# Patient Record
Sex: Male | Born: 1985 | Race: Black or African American | Hispanic: No | Marital: Married | Smoking: Never smoker
Health system: Southern US, Community
[De-identification: ages and names within clinical notes are randomized; demographics above are authoritative.]

---

## 2013-05-21 ENCOUNTER — Emergency Department (HOSPITAL_COMMUNITY)
Admission: EM | Admit: 2013-05-21 | Discharge: 2013-05-22 | Disposition: A | Discharge status: Home / Self Care | Payer: Worker's Compensation | Attending: Emergency Medicine | Admitting: Emergency Medicine

## 2013-05-21 ENCOUNTER — Encounter (HOSPITAL_COMMUNITY): Payer: Self-pay | Admitting: Emergency Medicine

## 2013-05-21 DIAGNOSIS — M2550 Pain in unspecified joint: Secondary | ICD-10-CM | POA: Diagnosis not present

## 2013-05-21 DIAGNOSIS — R220 Localized swelling, mass and lump, head: Secondary | ICD-10-CM | POA: Insufficient documentation

## 2013-05-21 DIAGNOSIS — L299 Pruritus, unspecified: Secondary | ICD-10-CM | POA: Insufficient documentation

## 2013-05-21 DIAGNOSIS — R6883 Chills (without fever): Secondary | ICD-10-CM | POA: Insufficient documentation

## 2013-05-21 DIAGNOSIS — L02419 Cutaneous abscess of limb, unspecified: Secondary | ICD-10-CM | POA: Insufficient documentation

## 2013-05-21 DIAGNOSIS — L509 Urticaria, unspecified: Secondary | ICD-10-CM | POA: Insufficient documentation

## 2013-05-21 DIAGNOSIS — R221 Localized swelling, mass and lump, neck: Secondary | ICD-10-CM | POA: Diagnosis not present

## 2013-05-21 DIAGNOSIS — L03115 Cellulitis of right lower limb: Secondary | ICD-10-CM

## 2013-05-21 DIAGNOSIS — L03119 Cellulitis of unspecified part of limb: Secondary | ICD-10-CM | POA: Insufficient documentation

## 2013-05-21 LAB — CBC WITH DIFFERENTIAL/PLATELET
Basophils Absolute: 0 K/uL (ref 0.0–0.1)
Basophils Relative: 0 % (ref 0–1)
Eosinophils Absolute: 0.1 10*3/uL (ref 0.0–0.7)
Eosinophils Relative: 2 % (ref 0–5)
HCT: 45.1 % (ref 39.0–52.0)
Hemoglobin: 15.7 g/dL (ref 13.0–17.0)
Lymphocytes Relative: 19 % (ref 12–46)
Lymphs Abs: 1.5 10*3/uL (ref 0.7–4.0)
MCH: 28.4 pg (ref 26.0–34.0)
MCHC: 34.8 g/dL (ref 30.0–36.0)
MCV: 81.7 fL (ref 78.0–100.0)
Monocytes Absolute: 0.7 10*3/uL (ref 0.1–1.0)
Monocytes Relative: 9 % (ref 3–12)
Neutro Abs: 5.8 K/uL (ref 1.7–7.7)
Neutrophils Relative %: 71 % (ref 43–77)
Platelets: 411 10*3/uL — ABNORMAL HIGH (ref 150–400)
RBC: 5.52 MIL/uL (ref 4.22–5.81)
RDW: 14 % (ref 11.5–15.5)
WBC: 8.1 K/uL (ref 4.0–10.5)

## 2013-05-21 LAB — COMPREHENSIVE METABOLIC PANEL
BUN: 21 mg/dL (ref 6–23)
CO2: 28 mEq/L (ref 19–32)
Calcium: 9.5 mg/dL (ref 8.4–10.5)
Creatinine, Ser: 1.68 mg/dL — ABNORMAL HIGH (ref 0.50–1.35)
GFR calc Af Amer: 64 mL/min — ABNORMAL LOW (ref >90)
GFR calc non Af Amer: 56 mL/min — ABNORMAL LOW (ref >90)
Glucose, Bld: 93 mg/dL (ref 70–99)
Sodium: 134 mEq/L — ABNORMAL LOW (ref 135–145)
Total Protein: 8 g/dL (ref 6.0–8.3)

## 2013-05-21 LAB — COMPREHENSIVE METABOLIC PANEL WITH GFR
ALT: 67 U/L — ABNORMAL HIGH (ref 0–53)
AST: 51 U/L — ABNORMAL HIGH (ref 0–37)
Albumin: 4.1 g/dL (ref 3.5–5.2)
Alkaline Phosphatase: 89 U/L (ref 39–117)
Chloride: 97 meq/L (ref 96–112)
Potassium: 4.1 meq/L (ref 3.5–5.1)
Total Bilirubin: 0.4 mg/dL (ref 0.3–1.2)

## 2013-05-21 MED ORDER — LORATADINE 10 MG PO TABS: 10.0000 mg | ORAL_TABLET | Freq: Every day | ORAL | Status: AC

## 2013-05-21 MED ORDER — CLINDAMYCIN HCL 300 MG PO CAPS
300.0000 mg | ORAL_CAPSULE | Freq: Once | ORAL | Status: AC
  Administered 2013-05-22: 300 mg via ORAL
  Filled 2013-05-21: qty 1

## 2013-05-21 MED ORDER — CLINDAMYCIN HCL 150 MG PO CAPS: 300.0000 mg | ORAL_CAPSULE | Freq: Three times a day (TID) | ORAL | Status: DC

## 2013-05-21 NOTE — Discharge Instructions (Signed)
Cellulitis Cellulitis is an infection of the skin and the tissue beneath it. The infected area is usually red and tender. Cellulitis occurs most often in the arms and lower legs.  CAUSES  Cellulitis is caused by bacteria that enter the skin through cracks or cuts in the skin. The most common types of bacteria that cause cellulitis are Staphylococcus and Streptococcus. SYMPTOMS   Redness and warmth.  Swelling.  Tenderness or pain.  Fever. DIAGNOSIS  Your caregiver can usually determine what is wrong based on a physical exam. Blood tests may also be done. TREATMENT  Treatment usually involves taking an antibiotic medicine. HOME CARE INSTRUCTIONS   Take your antibiotics as directed. Finish them even if you start to feel better.  Keep the infected arm or leg elevated to reduce swelling.  Apply a warm cloth to the affected area up to 4 times per day to relieve pain.  Only take over-the-counter or prescription medicines for pain, discomfort, or fever as directed by your caregiver.  Keep all follow-up appointments as directed by your caregiver. SEEK MEDICAL CARE IF:   You notice red streaks coming from the infected area.  Your red area gets larger or turns dark in color.  Your bone or joint underneath the infected area becomes painful after the skin has healed.  Your infection returns in the same area or another area.  You notice a swollen bump in the infected area.  You develop new symptoms. SEEK IMMEDIATE MEDICAL CARE IF:   You have a fever.  You feel very sleepy.  You develop vomiting or diarrhea.  You have a general ill feeling (malaise) with muscle aches and pains. MAKE SURE YOU:   Understand these instructions.  Will watch your condition.  Will get help right away if you are not doing well or get worse. Document Released: 09/04/2005 Document Revised: 05/26/2012 Document Reviewed: 02/10/2012 [employer] Patient Information 2014 [employer].  

## 2013-05-21 NOTE — ED Provider Notes (Signed)
History     CSN: [id number]  Arrival date & time 05/21/13  2147   First MD Initiated Contact with Patient 05/21/13 2321      Chief Complaint  Patient presents with  . Leg Swelling    (Consider location/radiation/quality/duration/timing/severity/associated sxs/prior treatment) HPI Comments: Pt walks a lot at his new job of 6 months.  He may have developed a blister, he pulled a "scab" off the top of his great toe on right foot this AM.  By tonight, right lower leg has developed redness, some swelling, not up to his knee.  Some chills, no overt fevers.  He denies h/o DM, HIV.  Also his chronic urticaria, he gets whelps on his arms when he scratches which makes itching worse.  No sig SOB, will get hives and his face and eyes will get puffy at times.  No wheezing, stridor, no throat swelling.  He attributes to timing that he got his new job.  Has not taken anything specific for it.     Patient is a 27 y.o. male presenting with urticaria and leg pain. The history is provided by the patient and the spouse.  Urticaria This is a chronic problem. Episode onset: 5-6 months ago when he began his new job. The problem occurs every several days. The problem has not changed since onset.Pertinent negatives include no shortness of breath. Exacerbated by: possibly being at work. Nothing relieves the symptoms. He has tried nothing for the symptoms.  Leg Pain Location:  Foot and leg Time since incident:  1 day Leg location:  R lower leg Foot location:  Dorsum of R foot Pain details:    Quality:  Burning, throbbing and aching   Radiates to:  Does not radiate   Severity:  Moderate   Onset quality:  Gradual   Duration:  1 day   Timing:  Constant   Progression:  Worsening Chronicity:  New Relieved by:  NSAIDs Worsened by:  Nothing tried Associated symptoms: swelling   Associated symptoms: no muscle weakness and no tingling   Associated symptoms comment:  Chills   History reviewed. No pertinent past  medical history.  History reviewed. No pertinent past surgical history.  History reviewed. No pertinent family history.  History  Substance Use Topics  . Smoking status: Never Smoker   . Smokeless tobacco: Not on file  . Alcohol Use: Yes      Review of Systems  Constitutional: Positive for chills.  HENT: Positive for facial swelling. Negative for sore throat and trouble swallowing.   Eyes: Positive for itching. Negative for redness.  Respiratory: Negative for shortness of breath.   Gastrointestinal: Negative for nausea, vomiting and diarrhea.  Musculoskeletal: Positive for arthralgias.  Skin: Positive for color change and wound.  All other systems reviewed and are negative.    Allergies  Shrimp  Home Medications   Current Outpatient Rx  Name  Route  Sig  Dispense  Refill  . Ibuprofen (IBU PO)   Oral   Take 2 tablets by mouth once.         . clindamycin (CLEOCIN) 150 MG capsule   Oral   Take 2 capsules (300 mg total) by mouth 3 (three) times daily.   30 capsule   0   . loratadine (CLARITIN) 10 MG tablet   Oral   Take 1 tablet (10 mg total) by mouth daily.   30 tablet   0     BP 150/57  Pulse 90  Temp(Src) 99.5 F (37.5  C) (Oral)  Resp 16  SpO2 98%  Physical Exam  Nursing note and vitals reviewed. Constitutional: He appears well-developed and well-nourished. No distress.  HENT:  Head: Normocephalic and atraumatic.  Eyes: Conjunctivae are normal. Pupils are equal, round, and reactive to light. Right eye exhibits no discharge. Left eye exhibits no discharge. No scleral icterus.  Cardiovascular: Normal rate, regular rhythm and intact distal pulses.   No murmur heard. Pulmonary/Chest: Effort normal. No respiratory distress. He has no wheezes.  Musculoskeletal: He exhibits edema and tenderness.       Right lower leg: He exhibits tenderness, swelling and edema.       Legs: Neurological: He is alert.  Skin: Skin is warm and dry. No rash noted. He is  not diaphoretic.    ED Course  Procedures (including critical care time)  Labs Reviewed  CBC WITH DIFFERENTIAL - Abnormal; Notable for the following:    Platelets 411 (*)    All other components within normal limits  COMPREHENSIVE METABOLIC PANEL - Abnormal; Notable for the following:    Sodium 134 (*)    Creatinine, Ser 1.68 (*)    AST 51 (*)    ALT 67 (*)    GFR calc non Af Amer 56 (*)    GFR calc Af Amer 64 (*)    All other components within normal limits   No results found.   1. Cellulitis of right lower leg     ra sat is 98% and I interpret to be normal  MDM  Pt with cellulitis of right foot and lower leg.  Pt is healthy, not septic appearing.  Ibuprofen, po abx recommended.  Also will give claritin for chronic urticaria, possibly related to allergen at work or due to environment.  No signs of anaphylaxis.             Gavin Pound. Oletta Lamas, MD 05/22/13 1610

## 2013-05-21 NOTE — ED Notes (Signed)
PT. REPORTS PROGRESSING RIGHT LOWER LEG  AND RIGHT FOOT PAIN / SWELLING ONSET LAST NIGHT WITH LOW GRADE FEVER / CHILLS.

## 2013-05-22 MED ORDER — CLINDAMYCIN HCL 300 MG PO CAPS: 300.0000 mg | ORAL_CAPSULE | Freq: Three times a day (TID) | ORAL | Status: AC

## 2013-10-09 ENCOUNTER — Emergency Department (HOSPITAL_COMMUNITY)
Admission: EM | Admit: 2013-10-09 | Discharge: 2013-10-09 | Disposition: A | Discharge status: Home / Self Care | Payer: BC Managed Care – PPO | Attending: Emergency Medicine | Admitting: Emergency Medicine

## 2013-10-09 ENCOUNTER — Emergency Department (HOSPITAL_COMMUNITY): Payer: BC Managed Care – PPO

## 2013-10-09 ENCOUNTER — Encounter (HOSPITAL_COMMUNITY): Payer: Self-pay | Admitting: Emergency Medicine

## 2013-10-09 DIAGNOSIS — R7402 Elevation of levels of lactic acid dehydrogenase (LDH): Secondary | ICD-10-CM | POA: Insufficient documentation

## 2013-10-09 DIAGNOSIS — S21209A Unspecified open wound of unspecified back wall of thorax without penetration into thoracic cavity, initial encounter: Secondary | ICD-10-CM | POA: Insufficient documentation

## 2013-10-09 DIAGNOSIS — R7401 Elevation of levels of liver transaminase levels: Secondary | ICD-10-CM | POA: Insufficient documentation

## 2013-10-09 DIAGNOSIS — S01501A Unspecified open wound of lip, initial encounter: Secondary | ICD-10-CM | POA: Insufficient documentation

## 2013-10-09 DIAGNOSIS — R7989 Other specified abnormal findings of blood chemistry: Secondary | ICD-10-CM

## 2013-10-09 DIAGNOSIS — Z23 Encounter for immunization: Secondary | ICD-10-CM | POA: Insufficient documentation

## 2013-10-09 DIAGNOSIS — S0083XA Contusion of other part of head, initial encounter: Secondary | ICD-10-CM

## 2013-10-09 DIAGNOSIS — S01511A Laceration without foreign body of lip, initial encounter: Secondary | ICD-10-CM

## 2013-10-09 DIAGNOSIS — S0003XA Contusion of scalp, initial encounter: Secondary | ICD-10-CM | POA: Insufficient documentation

## 2013-10-09 DIAGNOSIS — S21211A Laceration without foreign body of right back wall of thorax without penetration into thoracic cavity, initial encounter: Secondary | ICD-10-CM

## 2013-10-09 LAB — COMPREHENSIVE METABOLIC PANEL
ALT: 43 U/L (ref 0–53)
Alkaline Phosphatase: 96 U/L (ref 39–117)
BUN: 18 mg/dL (ref 6–23)
Calcium: 9.5 mg/dL (ref 8.4–10.5)
Chloride: 97 mEq/L (ref 96–112)
Creatinine, Ser: 1.42 mg/dL — ABNORMAL HIGH (ref 0.50–1.35)
GFR calc Af Amer: 78 mL/min — ABNORMAL LOW (ref >90)
Glucose, Bld: 131 mg/dL — ABNORMAL HIGH (ref 70–99)
Total Protein: 8.4 g/dL — ABNORMAL HIGH (ref 6.0–8.3)

## 2013-10-09 LAB — TYPE AND SCREEN
ABO/RH(D): B POS
Antibody Screen: NEGATIVE
Unit division: 0
Unit division: 0
Unit division: 0

## 2013-10-09 LAB — CBC
HCT: 45.2 % (ref 39.0–52.0)
Hemoglobin: 15.1 g/dL (ref 13.0–17.0)
MCHC: 33.4 g/dL (ref 30.0–36.0)
Platelets: ADEQUATE 10*3/uL (ref 150–400)
RBC: 5.21 MIL/uL (ref 4.22–5.81)
RDW: 13.3 % (ref 11.5–15.5)
WBC: 6.5 10*3/uL (ref 4.0–10.5)

## 2013-10-09 LAB — ABO/RH: ABO/RH(D): B POS

## 2013-10-09 LAB — POCT I-STAT, CHEM 8
BUN: 19 mg/dL (ref 6–23)
Chloride: 102 mEq/L (ref 96–112)
Potassium: 3.5 mEq/L (ref 3.5–5.1)
Sodium: 139 mEq/L (ref 135–145)
TCO2: 20 mmol/L (ref 0–100)

## 2013-10-09 LAB — PROTIME-INR
INR: 1.09 (ref 0.00–1.49)
Prothrombin Time: 13.9 seconds (ref 11.6–15.2)

## 2013-10-09 LAB — CG4 I-STAT (LACTIC ACID): Lactic Acid, Venous: 8.02 mmol/L — ABNORMAL HIGH (ref 0.5–2.2)

## 2013-10-09 MED ORDER — FENTANYL CITRATE 0.05 MG/ML IJ SOLN
50.0000 ug | INTRAMUSCULAR | Status: DC | PRN
  Administered 2013-10-09: 50 ug via INTRAVENOUS

## 2013-10-09 MED ORDER — TETANUS-DIPHTH-ACELL PERTUSSIS 5-2.5-18.5 LF-MCG/0.5 IM SUSP
0.5000 mL | Freq: Once | INTRAMUSCULAR | Status: AC
  Administered 2013-10-09: 0.5 mL via INTRAMUSCULAR

## 2013-10-09 MED ORDER — OXYCODONE-ACETAMINOPHEN 5-325 MG PO TABS: 1.0000 | ORAL_TABLET | ORAL | Status: AC | PRN

## 2013-10-09 MED ORDER — TETANUS-DIPHTH-ACELL PERTUSSIS 5-2.5-18.5 LF-MCG/0.5 IM SUSP
INTRAMUSCULAR | Status: AC
  Filled 2013-10-09: qty 0.5

## 2013-10-09 MED ORDER — MORPHINE SULFATE 4 MG/ML IJ SOLN
4.0000 mg | Freq: Once | INTRAMUSCULAR | Status: AC
  Administered 2013-10-09: 4 mg via INTRAVENOUS
  Filled 2013-10-09: qty 1

## 2013-10-09 MED ORDER — SODIUM CHLORIDE 0.9 % IV BOLUS (SEPSIS)
1000.0000 mL | Freq: Once | INTRAVENOUS | Status: AC
  Administered 2013-10-09: 1000 mL via INTRAVENOUS

## 2013-10-09 MED ORDER — AMOXICILLIN 500 MG PO CAPS
1000.0000 mg | ORAL_CAPSULE | Freq: Once | ORAL | Status: AC
  Administered 2013-10-09: 1000 mg via ORAL
  Filled 2013-10-09: qty 2

## 2013-10-09 MED ORDER — IOHEXOL 300 MG/ML  SOLN
100.0000 mL | Freq: Once | INTRAMUSCULAR | Status: AC | PRN
  Administered 2013-10-09: 100 mL via INTRAVENOUS

## 2013-10-09 MED ORDER — OXYCODONE-ACETAMINOPHEN 5-325 MG PO TABS
1.0000 | ORAL_TABLET | Freq: Once | ORAL | Status: AC
  Administered 2013-10-09: 1 via ORAL
  Filled 2013-10-09: qty 1

## 2013-10-09 MED ORDER — ONDANSETRON HCL 4 MG/2ML IJ SOLN
4.0000 mg | Freq: Once | INTRAMUSCULAR | Status: AC
  Administered 2013-10-09: 4 mg via INTRAVENOUS
  Filled 2013-10-09: qty 2

## 2013-10-09 MED ORDER — AMOXICILLIN 500 MG PO TABS: 1000.0000 mg | ORAL_TABLET | Freq: Two times a day (BID) | ORAL | Status: AC

## 2013-10-09 MED ORDER — SODIUM CHLORIDE 0.9 % IV BOLUS (SEPSIS)
100.0000 mL | Freq: Once | INTRAVENOUS | Status: AC
  Administered 2013-10-09: 1000 mL via INTRAVENOUS

## 2013-10-09 MED ORDER — FENTANYL CITRATE 0.05 MG/ML IJ SOLN
INTRAMUSCULAR | Status: AC
Start: 2013-10-09 — End: 2013-10-09
  Administered 2013-10-09: 50 ug via INTRAVENOUS
  Filled 2013-10-09: qty 2

## 2013-10-09 NOTE — ED Notes (Signed)
CT finished

## 2013-10-09 NOTE — ED Notes (Signed)
back from CT, up to w/c to go to b/r. Mentions some nausea.

## 2013-10-09 NOTE — ED Notes (Signed)
Lab at BS 
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
## 2013-10-09 NOTE — ED Notes (Signed)
Pt attempting to use urinal, unable, will try again, family at [facility] x2, no changes, VSS, pt to CT via stretcher.

## 2013-10-09 NOTE — ED Notes (Signed)
Into CT1, VSS, no changes.

## 2013-10-09 NOTE — ED Notes (Signed)
Dr. Teodoro Fleeting in to speak with and update family.

## 2013-10-09 NOTE — ED Notes (Signed)
Pt and family given PO juice, tolerating.
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
## 2013-10-09 NOTE — ED Notes (Signed)
Scan beginning, no changes, tolerating CT.
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
## 2013-10-09 NOTE — ED Notes (Addendum)
Xray finished at [facility]. Clamer, alert, NAD, calm, interactive, skin W&D, resps e/u, speaking in clear complete sentences. Wife in w/r.
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
## 2013-10-09 NOTE — ED Notes (Signed)
To CT, pt speaking with [employer].

## 2013-10-09 NOTE — ED Notes (Signed)
NRB applied, alert, anxious, hyperventilating, c-collar removed by Dr. Lindie Spruce.

## 2013-10-09 NOTE — ED Notes (Signed)
oneg emergency blood arrived to room
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
## 2013-10-11 LAB — BLOOD PRODUCT ORDER (VERBAL) VERIFICATION

## 2013-10-12 ENCOUNTER — Encounter (HOSPITAL_COMMUNITY): Payer: Self-pay | Admitting: Emergency Medicine

## 2014-01-27 ENCOUNTER — Emergency Department (HOSPITAL_BASED_OUTPATIENT_CLINIC_OR_DEPARTMENT_OTHER): Payer: BC Managed Care – PPO

## 2014-01-27 ENCOUNTER — Encounter (HOSPITAL_BASED_OUTPATIENT_CLINIC_OR_DEPARTMENT_OTHER): Payer: Self-pay | Admitting: Emergency Medicine

## 2014-01-27 ENCOUNTER — Emergency Department (HOSPITAL_BASED_OUTPATIENT_CLINIC_OR_DEPARTMENT_OTHER)
Admission: EM | Admit: 2014-01-27 | Discharge: 2014-01-27 | Disposition: A | Discharge status: Home / Self Care | Payer: BC Managed Care – PPO | Attending: Emergency Medicine | Admitting: Emergency Medicine

## 2014-01-27 DIAGNOSIS — R1031 Right lower quadrant pain: Secondary | ICD-10-CM | POA: Insufficient documentation

## 2014-01-27 DIAGNOSIS — Z79899 Other long term (current) drug therapy: Secondary | ICD-10-CM | POA: Insufficient documentation

## 2014-01-27 DIAGNOSIS — R109 Unspecified abdominal pain: Secondary | ICD-10-CM

## 2014-01-27 DIAGNOSIS — K529 Noninfective gastroenteritis and colitis, unspecified: Secondary | ICD-10-CM

## 2014-01-27 DIAGNOSIS — K5289 Other specified noninfective gastroenteritis and colitis: Secondary | ICD-10-CM | POA: Insufficient documentation

## 2014-01-27 DIAGNOSIS — Z792 Long term (current) use of antibiotics: Secondary | ICD-10-CM | POA: Insufficient documentation

## 2014-01-27 LAB — COMPREHENSIVE METABOLIC PANEL
ALT: 35 U/L (ref 0–53)
AST: 31 U/L (ref 0–37)
Albumin: 4.1 g/dL (ref 3.5–5.2)
Alkaline Phosphatase: 71 U/L (ref 39–117)
BUN: 11 mg/dL (ref 6–23)
CALCIUM: 8.9 mg/dL (ref 8.4–10.5)
CO2: 25 mEq/L (ref 19–32)
Chloride: 98 mEq/L (ref 96–112)
Creatinine, Ser: 1.2 mg/dL (ref 0.50–1.35)
GFR calc non Af Amer: 83 mL/min — ABNORMAL LOW (ref >90)
GLUCOSE: 109 mg/dL — AB (ref 70–99)
Potassium: 3.7 mEq/L (ref 3.7–5.3)
SODIUM: 136 meq/L — AB (ref 137–147)
TOTAL PROTEIN: 7.5 g/dL (ref 6.0–8.3)
Total Bilirubin: 0.8 mg/dL (ref 0.3–1.2)

## 2014-01-27 LAB — CBC WITH DIFFERENTIAL/PLATELET
Basophils Absolute: 0 10*3/uL (ref 0.0–0.1)
Basophils Relative: 0 % (ref 0–1)
EOS ABS: 0 10*3/uL (ref 0.0–0.7)
EOS PCT: 1 % (ref 0–5)
HCT: 45.5 % (ref 39.0–52.0)
Hemoglobin: 15.1 g/dL (ref 13.0–17.0)
LYMPHS ABS: 1 10*3/uL (ref 0.7–4.0)
Lymphocytes Relative: 19 % (ref 12–46)
MCH: 28.4 pg (ref 26.0–34.0)
MCHC: 33.2 g/dL (ref 30.0–36.0)
MCV: 85.5 fL (ref 78.0–100.0)
Monocytes Absolute: 0.6 10*3/uL (ref 0.1–1.0)
Monocytes Relative: 11 % (ref 3–12)
Neutro Abs: 3.6 10*3/uL (ref 1.7–7.7)
Neutrophils Relative %: 69 % (ref 43–77)
PLATELETS: 366 10*3/uL (ref 150–400)
RBC: 5.32 MIL/uL (ref 4.22–5.81)
RDW: 14.1 % (ref 11.5–15.5)
WBC: 5.3 10*3/uL (ref 4.0–10.5)

## 2014-01-27 MED ORDER — IOHEXOL 300 MG/ML  SOLN
50.0000 mL | Freq: Once | INTRAMUSCULAR | Status: AC | PRN
  Administered 2014-01-27: 50 mL via ORAL

## 2014-01-27 MED ORDER — SODIUM CHLORIDE 0.9 % IV SOLN
Freq: Once | INTRAVENOUS | Status: AC
Start: 2014-01-27 — End: 2014-01-27
  Administered 2014-01-27: 1000 mL via INTRAVENOUS

## 2014-01-27 MED ORDER — ONDANSETRON HCL 8 MG PO TABS: 8.0000 mg | ORAL_TABLET | ORAL | Status: AC | PRN

## 2014-01-27 MED ORDER — SODIUM CHLORIDE 0.9 % IV SOLN
Freq: Once | INTRAVENOUS | Status: AC
  Administered 2014-01-27: 1000 mL via INTRAVENOUS

## 2014-01-27 MED ORDER — IOHEXOL 300 MG/ML  SOLN
100.0000 mL | Freq: Once | INTRAMUSCULAR | Status: AC | PRN
  Administered 2014-01-27: 100 mL via INTRAVENOUS

## 2014-01-27 MED ORDER — ONDANSETRON HCL 4 MG/2ML IJ SOLN
4.0000 mg | Freq: Once | INTRAMUSCULAR | Status: AC
  Administered 2014-01-27: 4 mg via INTRAVENOUS
  Filled 2014-01-27: qty 2

## 2014-01-27 MED ORDER — MORPHINE SULFATE 4 MG/ML IJ SOLN
4.0000 mg | Freq: Once | INTRAMUSCULAR | Status: AC
  Administered 2014-01-27: 4 mg via INTRAVENOUS
  Filled 2014-01-27: qty 1

## 2014-01-27 NOTE — Discharge Instructions (Signed)
Zofran as prescribed as needed for nausea.  Return to the emergency department for severe abdominal pain, bloody stool, high fever, or other new or concerning symptoms.   Viral Gastroenteritis Viral gastroenteritis is also known as stomach flu. This condition affects the stomach and intestinal tract. It can cause sudden diarrhea and vomiting. The illness typically lasts 3 to 8 days. Most people develop an immune response that eventually gets rid of the virus. While this natural response develops, the virus can make you quite ill. CAUSES  Many different viruses can cause gastroenteritis, such as rotavirus or noroviruses. You can catch one of these viruses by consuming contaminated food or water. You may also catch a virus by sharing utensils or other personal items with an infected person or by touching a contaminated surface. SYMPTOMS  The most common symptoms are diarrhea and vomiting. These problems can cause a severe loss of body fluids (dehydration) and a body salt (electrolyte) imbalance. Other symptoms may include:  Fever.  Headache.  Fatigue.  Abdominal pain. DIAGNOSIS  Your caregiver can usually diagnose viral gastroenteritis based on your symptoms and a physical exam. A stool sample may also be taken to test for the presence of viruses or other infections. TREATMENT  This illness typically goes away on its own. Treatments are aimed at rehydration. The most serious cases of viral gastroenteritis involve vomiting so severely that you are not able to keep fluids down. In these cases, fluids must be given through an intravenous line (IV). HOME CARE INSTRUCTIONS   Drink enough fluids to keep your urine clear or pale yellow. Drink small amounts of fluids frequently and increase the amounts as tolerated.  Ask your caregiver for specific rehydration instructions.  Avoid:  Foods high in sugar.  Alcohol.  Carbonated drinks.  Tobacco.  Juice.  Caffeine drinks.  Extremely hot  or cold fluids.  Fatty, greasy foods.  Too much intake of anything at one time.  Dairy products until 24 to 48 hours after diarrhea stops.  You may consume probiotics. Probiotics are active cultures of beneficial bacteria. They may lessen the amount and number of diarrheal stools in adults. Probiotics can be found in yogurt with active cultures and in supplements.  Wash your hands well to avoid spreading the virus.  Only take over-the-counter or prescription medicines for pain, discomfort, or fever as directed by your caregiver. Do not give aspirin to children. Antidiarrheal medicines are not recommended.  Ask your caregiver if you should continue to take your regular prescribed and over-the-counter medicines.  Keep all follow-up appointments as directed by your caregiver. SEEK IMMEDIATE MEDICAL CARE IF:   You are unable to keep fluids down.  You do not urinate at least once every 6 to 8 hours.  You develop shortness of breath.  You notice blood in your stool or vomit. This may look like coffee grounds.  You have abdominal pain that increases or is concentrated in one small area (localized).  You have persistent vomiting or diarrhea.  You have a fever.  The patient is a child younger than 3 months, and he or she has a fever.  The patient is a child older than 3 months, and he or she has a fever and persistent symptoms.  The patient is a child older than 3 months, and he or she has a fever and symptoms suddenly get worse.  The patient is a baby, and he or she has no tears when crying. MAKE SURE YOU:   Understand these instructions.  Will watch your condition.  Will get help right away if you are not doing well or get worse. Document Released: 11/25/2005 Document Revised: 02/17/2012 Document Reviewed: 09/11/2011 [facility] Patient Information 2014 [employer].

## 2014-01-27 NOTE — ED Notes (Signed)
C/o abd cramping, n/v diarrhea onset last pm

## 2014-01-27 NOTE — ED Provider Notes (Signed)
CSN: [id number]     Arrival date & time 01/27/14  1932 History   This chart was scribed for Geoffery Lyons, MD by Ladona Ridgel Day, ED scribe. This patient was seen in room MH03/MH03 and the patient's care was started at 1932.  Chief Complaint  Patient presents with  . Abdominal Pain   The history is provided by the patient. No language interpreter was used.   HPI Comments: Elick Aguilera is a 28 y.o. male who presents to the Emergency Department complaining of gradually worsened, intermittent cramping upper abdominal pain, diarrhea, dry heaving and fever (max T 102 F), onset last PM after dinner with coworkers. Denies blood in stool or vomitus, denies dysuria or urinary sx. He denies any sick contacts.   Denies previous abdominal surgeries. He takes no medicines  History reviewed. No pertinent past medical history. History reviewed. No pertinent past surgical history. No family history on file. History  Substance Use Topics  . Smoking status: Never Smoker   . Smokeless tobacco: Not on file  . Alcohol Use: No    Review of Systems  Constitutional: Negative for fever and chills.  Respiratory: Negative for cough and shortness of breath.   Cardiovascular: Negative for chest pain.  Gastrointestinal: Positive for nausea, vomiting, abdominal pain and diarrhea.  Musculoskeletal: Negative for back pain.  All other systems reviewed and are negative.   Allergies  Shrimp and Shrimp  Home Medications   Current Outpatient Rx  Name  Route  Sig  Dispense  Refill  . amoxicillin (AMOXIL) 500 MG tablet   Oral   Take 2 tablets (1,000 mg total) by mouth 2 (two) times daily.   20 tablet   0   . clindamycin (CLEOCIN) 300 MG capsule   Oral   Take 1 capsule (300 mg total) by mouth 3 (three) times daily.   30 capsule   0   . Ibuprofen (IBU PO)   Oral   Take 2 tablets by mouth once.         . loratadine (CLARITIN) 10 MG tablet   Oral   Take 1 tablet (10 mg total) by mouth daily.   30  tablet   0   . oxyCODONE-acetaminophen (PERCOCET) 5-325 MG per tablet   Oral   Take 1 tablet by mouth every 4 (four) hours as needed for pain.   20 tablet   0    Triage Vitals: BP 147/60  Pulse 85  Temp(Src) 98.8 F (37.1 C) (Oral)  Resp 16  Ht 6\' 4"  (1.93 m)  Wt 230 lb (104.327 kg)  BMI 28.01 kg/m2  SpO2 99%  Physical Exam  Nursing note and vitals reviewed. Constitutional: He is oriented to person, place, and time. He appears well-developed and well-nourished. No distress.  HENT:  Head: Normocephalic and atraumatic.  Eyes: Conjunctivae are normal. Right eye exhibits no discharge. Left eye exhibits no discharge.  Neck: Normal range of motion.  Cardiovascular: Normal rate.   Pulmonary/Chest: Effort normal. No respiratory distress.  Abdominal: Soft. He exhibits no distension. There is tenderness. There is no rebound and no guarding.  RLQ tenderness to palpation, no rebound or guarding.   Musculoskeletal: Normal range of motion. He exhibits no edema.  Neurological: He is alert and oriented to person, place, and time.  Skin: Skin is warm and dry.  Psychiatric: He has a normal mood and affect. Thought content normal.   ED Course  Procedures (including critical care time) DIAGNOSTIC STUDIES: Oxygen Saturation is 99% on room air,  normal by my interpretation.    COORDINATION OF CARE: At 81846 PM Discussed treatment plan with patient which includes blood work, pain medicine, abdominal CT. Patient agrees.   Labs Review Labs Reviewed - No data to display Imaging Review No results found.   MDM   Final diagnoses:  None    The patient is a 28 year old male who presents with complaints of abdominal pain, nausea, vomiting, and diarrhea. His exam reveals tenderness to palpation in the right lower quadrant. A CT scan was obtained which reveals no evidence for appendicitis. There is no elevation of white count and electrolytes are unremarkable. He is feeling better with fluids and  medications and I feel as though he is stable for discharge. He understands to return if his symptoms worsen or change.   I personally performed the services described in this documentation, which was scribed in my presence. The recorded information has been reviewed and is accurate.      Geoffery Lyonsouglas Kizzy Olafson, MD 01/27/14 2242

## 2014-01-27 NOTE — ED Notes (Addendum)
C/o n/v/d, abd pain, chills started last night-last dose tylenol 5pm

## 2015-06-26 IMAGING — CT CT ABD-PELV W/ CM
2 of 4 series · 16 of 46 positions shown, 18 images · IV contrast (APPLIED)
Comparison: No comparisons

CLINICAL DATA: Right lower quadrant pain, nausea, vomiting.

EXAM:
CT ABDOMEN AND PELVIS WITH CONTRAST
TECHNIQUE: Multidetector CT imaging of the abdomen and pelvis was performed
using the standard protocol following bolus administration of
intravenous contrast.
CONTRAST:  50mL OMNIPAQUE IOHEXOL 300 MG/ML SOLN, 100mL OMNIPAQUE
IOHEXOL 300 MG/ML SOLN

[Series 2: abd/pelvis 5.0 b31f · axial · 0.70mm/px · z∈[-404,+66]mm · 13 of 104 slices shown, 15 images]
[im 5/104  soft-tissue]
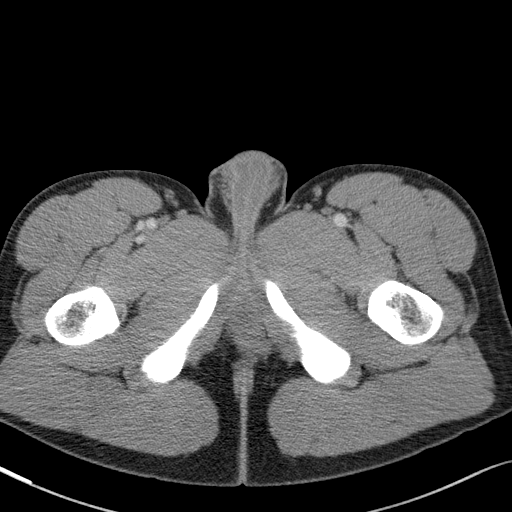
[im 5/104  bone]
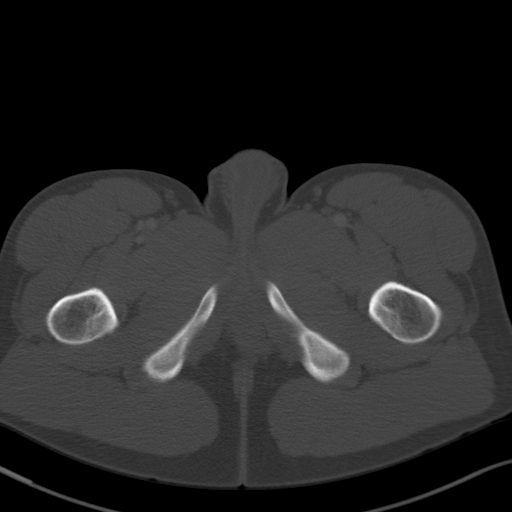
[im 13/104  soft-tissue]
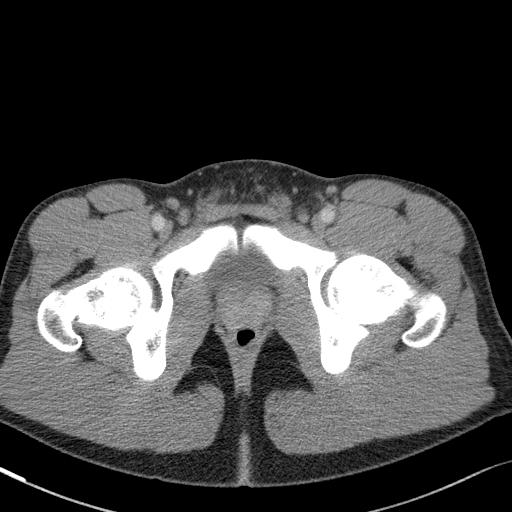
[im 22/104  soft-tissue]
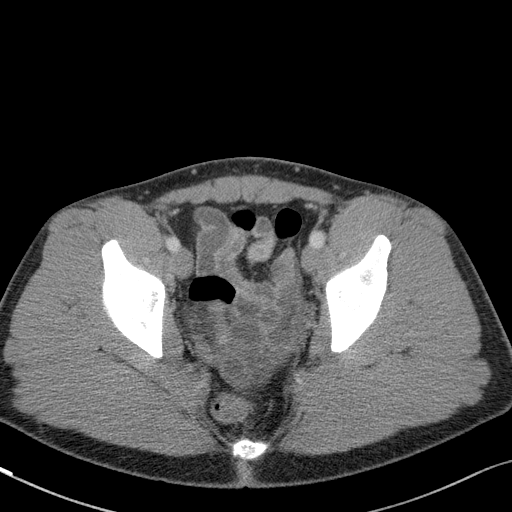
[im 31/104  soft-tissue]
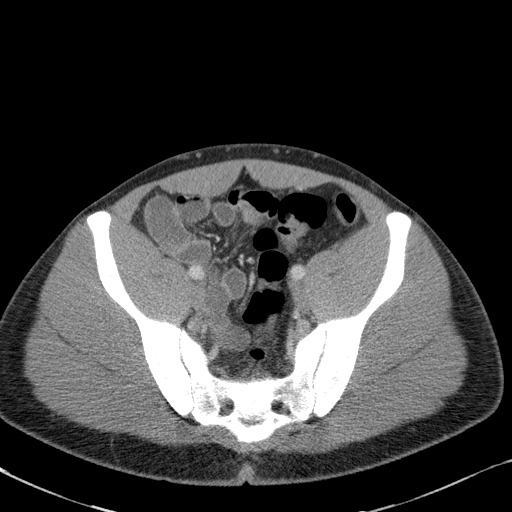
[im 35/104  soft-tissue]
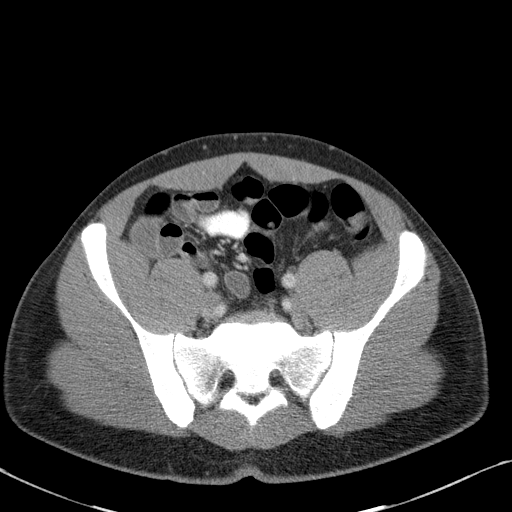
[im 43/104  soft-tissue]
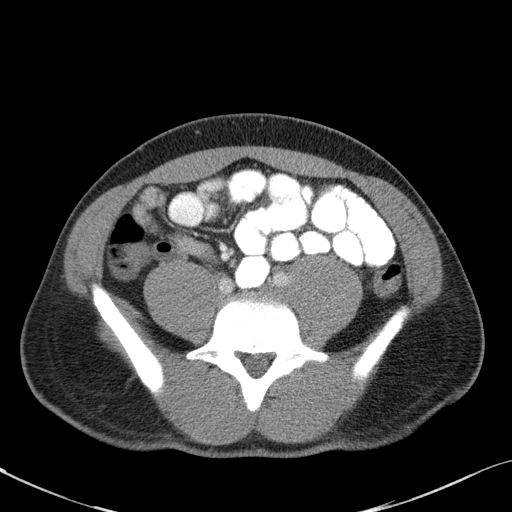
[im 52/104  soft-tissue]
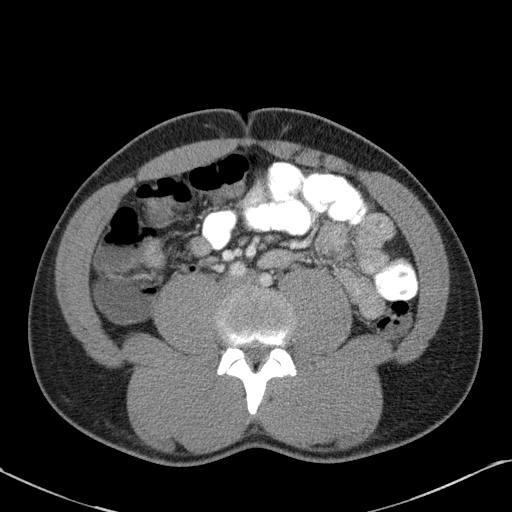
[im 61/104  soft-tissue]
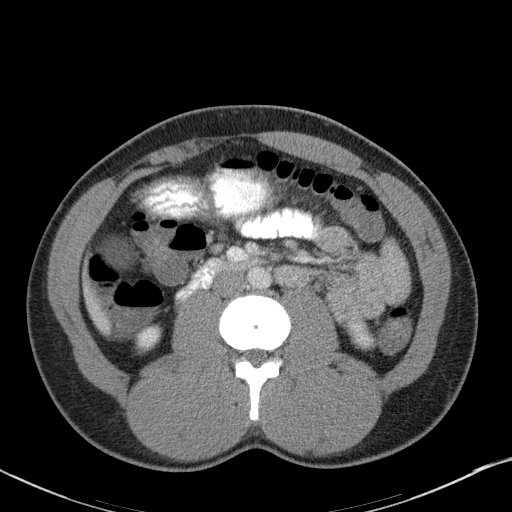
[im 69/104  soft-tissue]
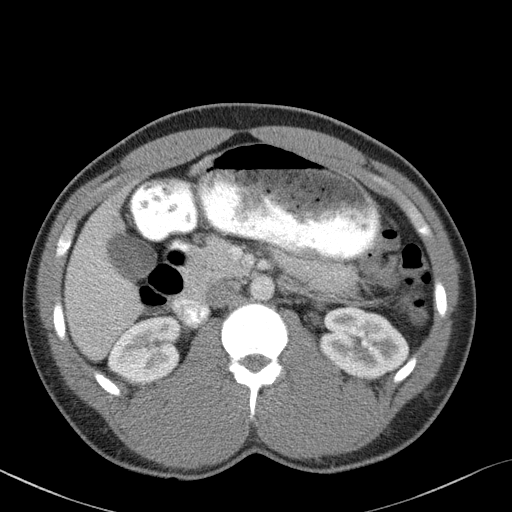
[im 69/104  bone]
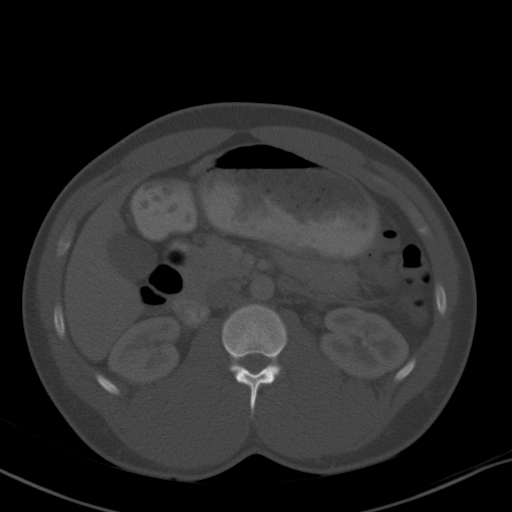
[im 73/104  soft-tissue]
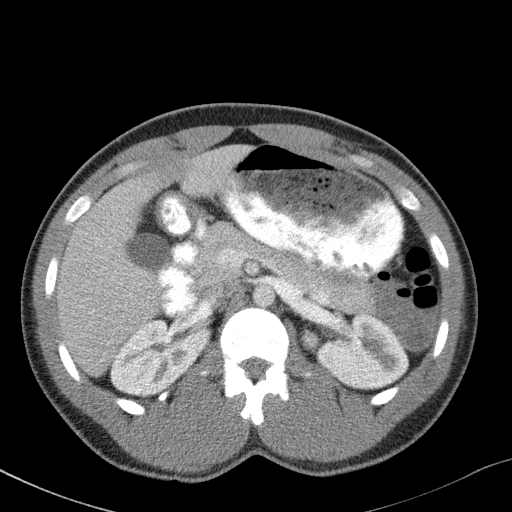
[im 82/104  soft-tissue]
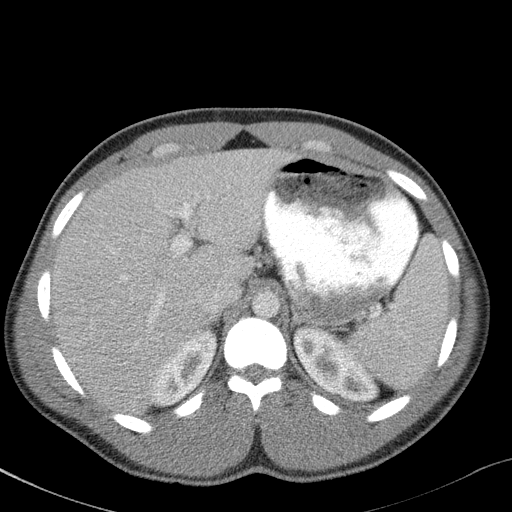
[im 91/104  soft-tissue]
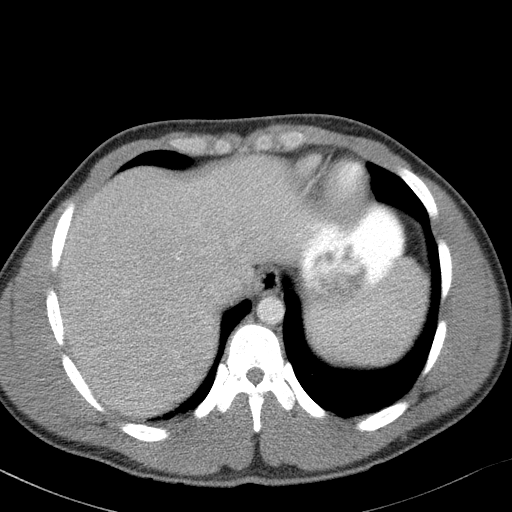
[im 99/104  soft-tissue]
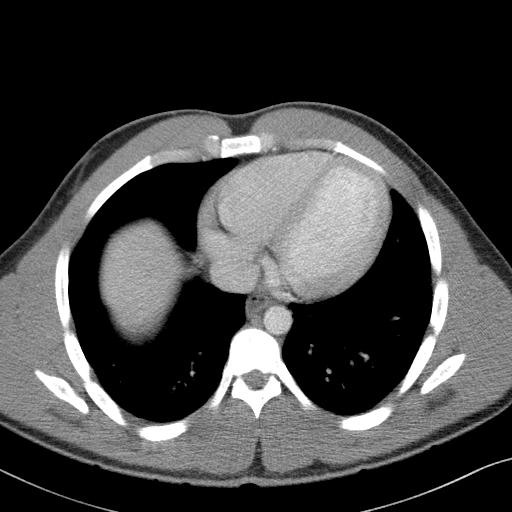

[Series 5: abd/pelvis 3.0 coronal · coronal · 0.76mm/px · 3 of 88 slices shown]
[im 30/88  soft-tissue]
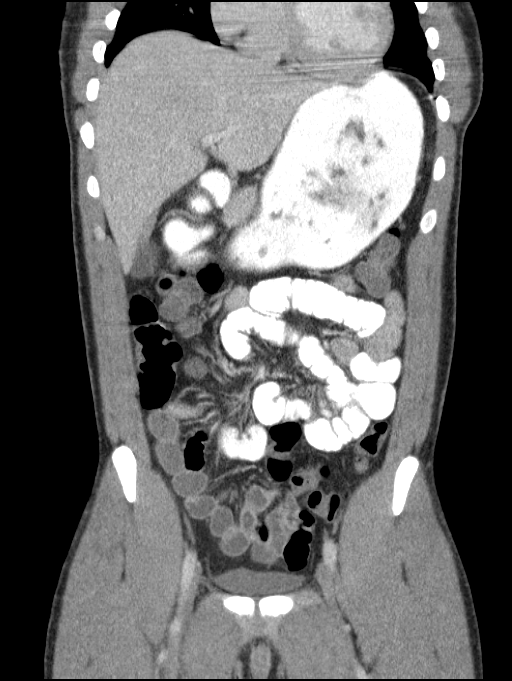
[im 39/88  soft-tissue]
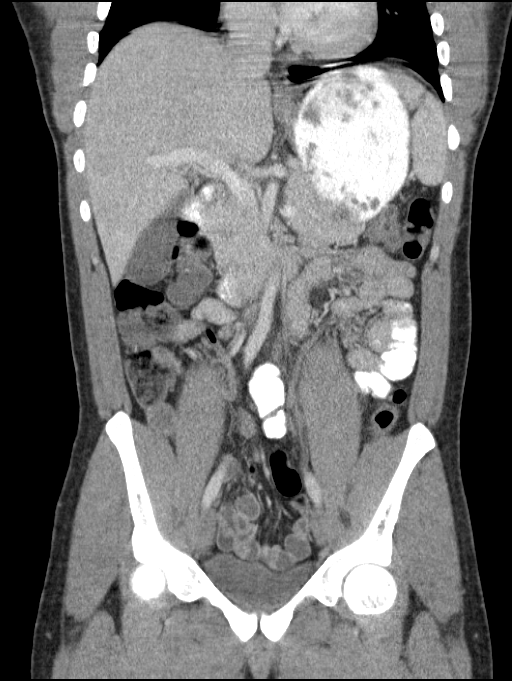
[im 49/88  soft-tissue]
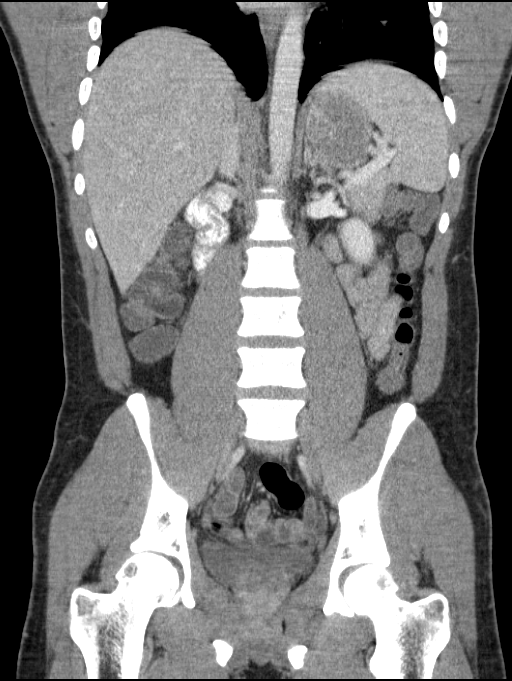

[16 of 46 positions shown; findings below may reference images not displayed]

FINDINGS: Lung bases are clear. No effusions. Heart is normal size.

Liver, gallbladder, spleen, pancreas, adrenals and kidneys are
normal.

Appendix is visualized, filled with gas and normal. Stomach, large
and small bowel are unremarkable. Aorta is normal caliber. No free
fluid, free air or adenopathy. Urinary bladder is normal,
decompressed.

No acute bony abnormality.
IMPRESSION: Normal appendix.

No acute findings in the abdomen or pelvis.
